# Patient Record
Sex: Male | Born: 1985 | Race: Black or African American | Hispanic: No | Marital: Single | Smoking: Current every day smoker
Health system: Southern US, Community
[De-identification: ages and names within clinical notes are randomized; demographics above are authoritative.]

## PROBLEM LIST (undated history)

## (undated) DIAGNOSIS — Z789 Other specified health status: Secondary | ICD-10-CM

## (undated) DIAGNOSIS — C8590 Non-Hodgkin lymphoma, unspecified, unspecified site: Secondary | ICD-10-CM

## (undated) HISTORY — PX: OTHER SURGICAL HISTORY: SHX169

---
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
## 2010-05-16 ENCOUNTER — Emergency Department (HOSPITAL_BASED_OUTPATIENT_CLINIC_OR_DEPARTMENT_OTHER)
Admission: EM | Admit: 2010-05-16 | Discharge: 2010-05-16 | Disposition: A | Discharge status: Home / Self Care | Payer: Self-pay | Attending: Emergency Medicine | Admitting: Emergency Medicine

## 2010-05-16 DIAGNOSIS — B86 Scabies: Secondary | ICD-10-CM | POA: Insufficient documentation

## 2010-05-19 LAB — GC/CHLAMYDIA PROBE AMP, GENITAL
Chlamydia, DNA Probe: NEGATIVE
GC Probe Amp, Genital: NEGATIVE

## 2011-12-08 ENCOUNTER — Encounter (HOSPITAL_COMMUNITY): Payer: Self-pay | Admitting: *Deleted

## 2011-12-08 ENCOUNTER — Emergency Department (HOSPITAL_COMMUNITY)
Admission: EM | Admit: 2011-12-08 | Discharge: 2011-12-08 | Disposition: A | Discharge status: Home / Self Care | Payer: Self-pay | Attending: Emergency Medicine | Admitting: Emergency Medicine

## 2011-12-08 DIAGNOSIS — X12XXXA Contact with other hot fluids, initial encounter: Secondary | ICD-10-CM | POA: Insufficient documentation

## 2011-12-08 DIAGNOSIS — IMO0002 Reserved for concepts with insufficient information to code with codable children: Secondary | ICD-10-CM

## 2011-12-08 DIAGNOSIS — Y93G9 Activity, other involving cooking and grilling: Secondary | ICD-10-CM | POA: Insufficient documentation

## 2011-12-08 DIAGNOSIS — T23239A Burn of second degree of unspecified multiple fingers (nail), not including thumb, initial encounter: Secondary | ICD-10-CM | POA: Insufficient documentation

## 2011-12-08 DIAGNOSIS — F17200 Nicotine dependence, unspecified, uncomplicated: Secondary | ICD-10-CM | POA: Insufficient documentation

## 2011-12-08 DIAGNOSIS — X131XXA Other contact with steam and other hot vapors, initial encounter: Secondary | ICD-10-CM | POA: Insufficient documentation

## 2011-12-08 DIAGNOSIS — Y929 Unspecified place or not applicable: Secondary | ICD-10-CM | POA: Insufficient documentation

## 2011-12-08 DIAGNOSIS — Z8589 Personal history of malignant neoplasm of other organs and systems: Secondary | ICD-10-CM | POA: Insufficient documentation

## 2011-12-08 HISTORY — DX: Non-Hodgkin lymphoma, unspecified, unspecified site: C85.90

## 2011-12-08 MED ORDER — SILVER SULFADIAZINE 1 % EX CREA: TOPICAL_CREAM | Freq: Every day | CUTANEOUS | Status: AC
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
## 2011-12-09 NOTE — ED Provider Notes (Signed)
Medical screening examination/treatment/procedure(s) were performed by non-physician practitioner and as supervising physician I was immediately available for consultation/collaboration.   Lyanne Co, MD 12/09/11 [phone]

## 2017-02-16 ENCOUNTER — Other Ambulatory Visit: Payer: Self-pay

## 2017-02-16 ENCOUNTER — Emergency Department (HOSPITAL_COMMUNITY)
Admission: EM | Admit: 2017-02-16 | Discharge: 2017-02-16 | Disposition: A | Discharge status: Home / Self Care | Payer: Self-pay | Attending: Emergency Medicine | Admitting: Emergency Medicine

## 2017-02-16 ENCOUNTER — Encounter (HOSPITAL_COMMUNITY): Payer: Self-pay | Admitting: Emergency Medicine

## 2017-02-16 DIAGNOSIS — Z5321 Procedure and treatment not carried out due to patient leaving prior to being seen by health care provider: Secondary | ICD-10-CM | POA: Insufficient documentation

## 2017-02-16 DIAGNOSIS — R109 Unspecified abdominal pain: Secondary | ICD-10-CM | POA: Insufficient documentation

## 2017-02-16 LAB — BASIC METABOLIC PANEL
Anion gap: 9 (ref 5–15)
BUN: 18 mg/dL (ref 6–20)
CALCIUM: 9.5 mg/dL (ref 8.9–10.3)
CHLORIDE: 103 mmol/L (ref 101–111)
CO2: 27 mmol/L (ref 22–32)
CREATININE: 1.12 mg/dL (ref 0.61–1.24)
GFR calc non Af Amer: >60 mL/min (ref >60)
Glucose, Bld: 109 mg/dL — ABNORMAL HIGH (ref 65–99)
Potassium: 3.4 mmol/L — ABNORMAL LOW (ref 3.5–5.1)
SODIUM: 139 mmol/L (ref 135–145)

## 2017-02-16 LAB — CBC
HCT: 42.3 % (ref 39.0–52.0)
Hemoglobin: 14.2 g/dL (ref 13.0–17.0)
MCH: 30.2 pg (ref 26.0–34.0)
MCHC: 33.6 g/dL (ref 30.0–36.0)
MCV: 90 fL (ref 78.0–100.0)
PLATELETS: 159 10*3/uL (ref 150–400)
RBC: 4.7 MIL/uL (ref 4.22–5.81)
RDW: 12.3 % (ref 11.5–15.5)
WBC: 9.7 10*3/uL (ref 4.0–10.5)

## 2017-02-16 LAB — URINALYSIS, ROUTINE W REFLEX MICROSCOPIC
BACTERIA UA: NONE SEEN
BILIRUBIN URINE: NEGATIVE
Glucose, UA: NEGATIVE mg/dL
HGB URINE DIPSTICK: NEGATIVE
KETONES UR: 5 mg/dL — AB
LEUKOCYTES UA: NEGATIVE
NITRITE: NEGATIVE
PH: 5 (ref 5.0–8.0)
Protein, ur: 100 mg/dL — AB
SPECIFIC GRAVITY, URINE: 1.027 (ref 1.005–1.030)

## 2017-02-16 NOTE — ED Triage Notes (Signed)
Pt c/o intermittent left sided flank pain x "a few days". Denies urinary symptoms.

## 2017-03-03 ENCOUNTER — Encounter (HOSPITAL_COMMUNITY): Payer: Self-pay | Admitting: Emergency Medicine

## 2017-03-03 ENCOUNTER — Emergency Department (HOSPITAL_COMMUNITY)
Admission: EM | Admit: 2017-03-03 | Discharge: 2017-03-03 | Disposition: A | Discharge status: Home / Self Care | Payer: Self-pay | Attending: Emergency Medicine | Admitting: Emergency Medicine

## 2017-03-03 ENCOUNTER — Other Ambulatory Visit: Payer: Self-pay

## 2017-03-03 DIAGNOSIS — F17210 Nicotine dependence, cigarettes, uncomplicated: Secondary | ICD-10-CM | POA: Insufficient documentation

## 2017-03-03 DIAGNOSIS — Y999 Unspecified external cause status: Secondary | ICD-10-CM | POA: Insufficient documentation

## 2017-03-03 DIAGNOSIS — X58XXXA Exposure to other specified factors, initial encounter: Secondary | ICD-10-CM | POA: Insufficient documentation

## 2017-03-03 DIAGNOSIS — Y929 Unspecified place or not applicable: Secondary | ICD-10-CM | POA: Insufficient documentation

## 2017-03-03 DIAGNOSIS — Y939 Activity, unspecified: Secondary | ICD-10-CM | POA: Insufficient documentation

## 2017-03-03 DIAGNOSIS — S39012A Strain of muscle, fascia and tendon of lower back, initial encounter: Secondary | ICD-10-CM | POA: Insufficient documentation

## 2017-03-03 MED ORDER — NAPROXEN 250 MG PO TABS
500.0000 mg | ORAL_TABLET | Freq: Once | ORAL | Status: AC
  Administered 2017-03-03: 500 mg via ORAL
  Filled 2017-03-03: qty 2

## 2017-03-03 MED ORDER — CYCLOBENZAPRINE HCL 5 MG PO TABS: 5.0000 mg | ORAL_TABLET | Freq: Two times a day (BID) | ORAL | 0 refills | Status: AC | PRN

## 2017-03-03 MED ORDER — NAPROXEN 500 MG PO TABS: 500.0000 mg | ORAL_TABLET | Freq: Two times a day (BID) | ORAL | 0 refills | Status: AC

## 2017-03-03 NOTE — ED Provider Notes (Signed)
[facility] EMERGENCY DEPARTMENT Provider Note   CSN: [id number] Arrival date & time: 03/03/17  0059     History   Chief Complaint Chief Complaint  Patient presents with  . Back Pain    HPI Frank Barber is a 32 y.o. male.  HPI  This is a 32 year old male who presents with back pain.  Patient reports 2-week history of bilateral back pain.  Worse with lifting.  Patient states that he lifts at work.  He has not taken anything for this pain.  He rates his pain at 6 out of 10.  It keeps him up at night.  He denies any urinary symptoms.  He denies any weakness, numbness, tingling of the lower extremities, bowel or bladder difficulty.  Past Medical History:  Diagnosis Date  . Lymphoma ([place])     There are no active problems to display for this patient.   Past Surgical History:  Procedure Laterality Date  . laporatomy         Home Medications    Prior to Admission medications   Medication Sig Start Date End Date Taking? Authorizing Provider  cyclobenzaprine (FLEXERIL) 5 MG tablet Take 1 tablet (5 mg total) by mouth 2 (two) times daily as needed for muscle spasms. 03/03/17   Demarea Lorey, Barbette Hair, MD  naproxen (NAPROSYN) 500 MG tablet Take 1 tablet (500 mg total) by mouth 2 (two) times daily. 03/03/17   Shiryl Ruddy, Barbette Hair, MD  silver sulfADIAZINE (SILVADENE) 1 % cream Apply topically daily. 12/08/11   Hess, Hessie Diener, PA-C    Family History History reviewed. No pertinent family history.  Social History Social History   Tobacco Use  . Smoking status: Current Every Day Smoker    Types: Cigarettes  . Smokeless tobacco: Never Used  Substance Use Topics  . Alcohol use: No  . Drug use: Yes    Types: Marijuana     Allergies   Patient has no known allergies.   Review of Systems Review of Systems  Constitutional: Negative for fever.  Genitourinary: Negative for difficulty urinating.  Musculoskeletal: Positive for back pain.  Neurological:  Negative for weakness and numbness.  All other systems reviewed and are negative.    Physical Exam Updated Vital Signs BP 126/74 (BP Location: Right Arm)   Pulse 75   Temp 97.9 F (36.6 C) (Oral)   Resp 20   Ht 6\' 1"  (1.854 m)   Wt 63.5 kg (140 lb)   SpO2 99%   BMI 18.47 kg/m   Physical Exam  Constitutional: He is oriented to person, place, and time. He appears well-developed and well-nourished.  HENT:  Head: Normocephalic and atraumatic.  Cardiovascular: Normal rate, regular rhythm and normal heart sounds.  No murmur heard. Pulmonary/Chest: Effort normal and breath sounds normal. No respiratory distress. He has no wheezes.  Musculoskeletal: He exhibits no edema.  Tenderness to palpation bilateral paraspinous muscle region of the upper lumbar spine, no midline tenderness, step-off, or deformity  Neurological: He is alert and oriented to person, place, and time.  5 out of 5 strength bilateral lower extremities, symmetric patellar reflexes bilaterally, no clonus  Skin: Skin is warm and dry.  Psychiatric: He has a normal mood and affect.  Nursing note and vitals reviewed.    ED Treatments / Results  Labs (all labs ordered are listed, but only abnormal results are displayed) Labs Reviewed - No data to display  EKG  EKG Interpretation None       Radiology  No results found.  Procedures Procedures (including critical care time)  Medications Ordered in ED Medications  naproxen (NAPROSYN) tablet 500 mg (not administered)     Initial Impression / Assessment and Plan / ED Course  I have reviewed the triage vital signs and the nursing notes.  Pertinent labs & imaging results that were available during my care of the patient were reviewed by me and considered in my medical decision making (see chart for details).     Patient presents with back pain.  Musculoskeletal in nature.  No red flags.  No signs or symptoms of cauda equina.  Recommend anti-inflammatory and  muscle relaxant.  Patient instructed not to drive or operate heavy machinery using muscle relaxers.  After history, exam, and medical workup I feel the patient has been appropriately medically screened and is safe for discharge home. Pertinent diagnoses were discussed with the patient. Patient was given return precautions.  Final Clinical Impressions(s) / ED Diagnoses   Final diagnoses:  Strain of lumbar region, initial encounter    ED Discharge Orders        Ordered    naproxen (NAPROSYN) 500 MG tablet  2 times daily     03/03/17 0219    cyclobenzaprine (FLEXERIL) 5 MG tablet  2 times daily PRN     03/03/17 0219       Merryl Hacker, MD 03/03/17 0221

## 2017-03-03 NOTE — ED Triage Notes (Signed)
Pt c/o 7/10 lower back pain for the past 2 weeks getting worse pt denies any injury no urinary symptoms

## 2017-03-03 NOTE — Discharge Instructions (Signed)
Seen today for back pain.  This is likely musculoskeletal in nature.  Take naproxen.  Flexeril as needed.  Do not drive or operate heavy machinery after taking Flexeril.  Avoid heavy lifting until symptoms improve.

## 2017-03-03 NOTE — ED Notes (Signed)
Pt reports heavy lifting while working. Pt complains of pain to lower back on sides not spine

## 2018-07-12 ENCOUNTER — Inpatient Hospital Stay (HOSPITAL_COMMUNITY)
Admission: EM | Admit: 2018-07-12 | Discharge: 2018-07-15 | DRG: 964 | Disposition: A | Discharge status: Home / Self Care | Payer: Self-pay | Attending: General Surgery | Admitting: General Surgery

## 2018-07-12 ENCOUNTER — Encounter (HOSPITAL_COMMUNITY): Payer: Self-pay

## 2018-07-12 ENCOUNTER — Emergency Department (HOSPITAL_COMMUNITY): Payer: Self-pay

## 2018-07-12 DIAGNOSIS — Y92410 Unspecified street and highway as the place of occurrence of the external cause: Secondary | ICD-10-CM

## 2018-07-12 DIAGNOSIS — Z9689 Presence of other specified functional implants: Secondary | ICD-10-CM

## 2018-07-12 DIAGNOSIS — S270XXA Traumatic pneumothorax, initial encounter: Principal | ICD-10-CM | POA: Diagnosis present

## 2018-07-12 DIAGNOSIS — S27322A Contusion of lung, bilateral, initial encounter: Secondary | ICD-10-CM | POA: Diagnosis present

## 2018-07-12 DIAGNOSIS — Z1159 Encounter for screening for other viral diseases: Secondary | ICD-10-CM

## 2018-07-12 DIAGNOSIS — S27331A Laceration of lung, unilateral, initial encounter: Secondary | ICD-10-CM | POA: Diagnosis present

## 2018-07-12 DIAGNOSIS — S41112A Laceration without foreign body of left upper arm, initial encounter: Secondary | ICD-10-CM | POA: Diagnosis present

## 2018-07-12 DIAGNOSIS — J939 Pneumothorax, unspecified: Secondary | ICD-10-CM | POA: Diagnosis present

## 2018-07-12 DIAGNOSIS — F1010 Alcohol abuse, uncomplicated: Secondary | ICD-10-CM | POA: Diagnosis present

## 2018-07-12 DIAGNOSIS — S27329A Contusion of lung, unspecified, initial encounter: Secondary | ICD-10-CM

## 2018-07-12 DIAGNOSIS — S36115A Moderate laceration of liver, initial encounter: Secondary | ICD-10-CM | POA: Diagnosis present

## 2018-07-12 DIAGNOSIS — S27339A Laceration of lung, unspecified, initial encounter: Secondary | ICD-10-CM

## 2018-07-12 DIAGNOSIS — E876 Hypokalemia: Secondary | ICD-10-CM | POA: Diagnosis present

## 2018-07-12 DIAGNOSIS — S32009A Unspecified fracture of unspecified lumbar vertebra, initial encounter for closed fracture: Secondary | ICD-10-CM

## 2018-07-12 DIAGNOSIS — S36113A Laceration of liver, unspecified degree, initial encounter: Secondary | ICD-10-CM

## 2018-07-12 DIAGNOSIS — Z4682 Encounter for fitting and adjustment of non-vascular catheter: Secondary | ICD-10-CM

## 2018-07-12 DIAGNOSIS — Y907 Blood alcohol level of 200-239 mg/100 ml: Secondary | ICD-10-CM | POA: Diagnosis present

## 2018-07-12 HISTORY — DX: Other specified health status: Z78.9

## 2018-07-12 LAB — COMPREHENSIVE METABOLIC PANEL
ALT: 181 U/L — ABNORMAL HIGH (ref 0–44)
AST: 311 U/L — ABNORMAL HIGH (ref 15–41)
Albumin: 4 g/dL (ref 3.5–5.0)
Alkaline Phosphatase: 40 U/L (ref 38–126)
Anion gap: 12 (ref 5–15)
BUN: 18 mg/dL (ref 6–20)
CO2: 22 mmol/L (ref 22–32)
Calcium: 8.5 mg/dL — ABNORMAL LOW (ref 8.9–10.3)
Chloride: 106 mmol/L (ref 98–111)
Creatinine, Ser: 1.31 mg/dL — ABNORMAL HIGH (ref 0.61–1.24)
GFR calc Af Amer: >60 mL/min (ref >60)
GFR calc non Af Amer: >60 mL/min (ref >60)
Glucose, Bld: 191 mg/dL — ABNORMAL HIGH (ref 70–99)
Potassium: 3.3 mmol/L — ABNORMAL LOW (ref 3.5–5.1)
Sodium: 140 mmol/L (ref 135–145)
Total Bilirubin: 1.2 mg/dL (ref 0.3–1.2)
Total Protein: 6.2 g/dL — ABNORMAL LOW (ref 6.5–8.1)

## 2018-07-12 LAB — POCT I-STAT 7, (LYTES, BLD GAS, ICA,H+H)
Acid-base deficit: 3 mmol/L — ABNORMAL HIGH (ref 0.0–2.0)
Bicarbonate: 22.4 mmol/L (ref 20.0–28.0)
Calcium, Ion: 1.11 mmol/L — ABNORMAL LOW (ref 1.15–1.40)
HCT: 39 % (ref 39.0–52.0)
Hemoglobin: 13.3 g/dL (ref 13.0–17.0)
O2 Saturation: 100 %
Patient temperature: 98.6
Potassium: 2.7 mmol/L — CL (ref 3.5–5.1)
Sodium: 139 mmol/L (ref 135–145)
TCO2: 24 mmol/L (ref 22–32)
pCO2 arterial: 38.7 mmHg (ref 32.0–48.0)
pH, Arterial: 7.371 (ref 7.350–7.450)
pO2, Arterial: 428 mmHg — ABNORMAL HIGH (ref 83.0–108.0)

## 2018-07-12 LAB — URINALYSIS, ROUTINE W REFLEX MICROSCOPIC
Bacteria, UA: NONE SEEN
Bilirubin Urine: NEGATIVE
Glucose, UA: NEGATIVE mg/dL
Ketones, ur: NEGATIVE mg/dL
Leukocytes,Ua: NEGATIVE
Nitrite: NEGATIVE
Protein, ur: NEGATIVE mg/dL
RBC / HPF: >50 RBC/hpf — ABNORMAL HIGH (ref 0–5)
Specific Gravity, Urine: 1.024 (ref 1.005–1.030)
WBC, UA: >50 WBC/hpf — ABNORMAL HIGH (ref 0–5)
pH: 7 (ref 5.0–8.0)

## 2018-07-12 LAB — I-STAT CHEM 8, ED
BUN: 23 mg/dL — ABNORMAL HIGH (ref 6–20)
Calcium, Ion: 1.05 mmol/L — ABNORMAL LOW (ref 1.15–1.40)
Chloride: 105 mmol/L (ref 98–111)
Creatinine, Ser: 1.7 mg/dL — ABNORMAL HIGH (ref 0.61–1.24)
Glucose, Bld: 185 mg/dL — ABNORMAL HIGH (ref 70–99)
HCT: 41 % (ref 39.0–52.0)
Hemoglobin: 13.9 g/dL (ref 13.0–17.0)
Potassium: 3.2 mmol/L — ABNORMAL LOW (ref 3.5–5.1)
Sodium: 140 mmol/L (ref 135–145)
TCO2: 24 mmol/L (ref 22–32)

## 2018-07-12 LAB — RAPID URINE DRUG SCREEN, HOSP PERFORMED
Amphetamines: NOT DETECTED
Barbiturates: NOT DETECTED
Benzodiazepines: NOT DETECTED
Cocaine: NOT DETECTED
Opiates: NOT DETECTED
Tetrahydrocannabinol: POSITIVE — AB

## 2018-07-12 LAB — CBC
HCT: 37.8 % — ABNORMAL LOW (ref 39.0–52.0)
Hemoglobin: 12.5 g/dL — ABNORMAL LOW (ref 13.0–17.0)
MCH: 31.7 pg (ref 26.0–34.0)
MCHC: 33.1 g/dL (ref 30.0–36.0)
MCV: 95.9 fL (ref 80.0–100.0)
Platelets: 175 10*3/uL (ref 150–400)
RBC: 3.94 MIL/uL — ABNORMAL LOW (ref 4.22–5.81)
RDW: 13.2 % (ref 11.5–15.5)
WBC: 15.4 10*3/uL — ABNORMAL HIGH (ref 4.0–10.5)
nRBC: 0.1 % (ref 0.0–0.2)

## 2018-07-12 LAB — CDS SEROLOGY

## 2018-07-12 LAB — ETHANOL: Alcohol, Ethyl (B): 213 mg/dL — ABNORMAL HIGH (ref <10)

## 2018-07-12 LAB — PROTIME-INR
INR: 1.2 (ref 0.8–1.2)
Prothrombin Time: 15.3 seconds — ABNORMAL HIGH (ref 11.4–15.2)

## 2018-07-12 LAB — SARS CORONAVIRUS 2 BY RT PCR (HOSPITAL ORDER, PERFORMED IN ~~LOC~~ HOSPITAL LAB): SARS Coronavirus 2: NEGATIVE

## 2018-07-12 MED ORDER — VECURONIUM BROMIDE 10 MG IV SOLR
INTRAVENOUS | Status: AC | PRN
  Administered 2018-07-12: 10 mg via INTRAVENOUS

## 2018-07-12 MED ORDER — VECURONIUM BROMIDE 10 MG IV SOLR
INTRAVENOUS | Status: AC
  Filled 2018-07-12: qty 10

## 2018-07-12 MED ORDER — ETOMIDATE 2 MG/ML IV SOLN
INTRAVENOUS | Status: AC | PRN
  Administered 2018-07-12: 20 mg via INTRAVENOUS

## 2018-07-12 MED ORDER — PROPOFOL 1000 MG/100ML IV EMUL
INTRAVENOUS | Status: AC
  Administered 2018-07-12: 5 ug/kg/min
  Filled 2018-07-12: qty 100

## 2018-07-12 MED ORDER — IOHEXOL 300 MG/ML  SOLN
100.0000 mL | Freq: Once | INTRAMUSCULAR | Status: AC | PRN
  Administered 2018-07-12: 100 mL via INTRAVENOUS

## 2018-07-12 MED ORDER — LORAZEPAM 2 MG/ML IJ SOLN
INTRAMUSCULAR | Status: AC
  Administered 2018-07-12: 22:00:00
  Filled 2018-07-12: qty 1

## 2018-07-12 MED ORDER — SUCCINYLCHOLINE CHLORIDE 20 MG/ML IJ SOLN
INTRAMUSCULAR | Status: AC | PRN
  Administered 2018-07-12: 100 mg via INTRAVENOUS
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
## 2018-07-12 NOTE — ED Provider Notes (Signed)
Procedure Name: Intubation Date/Time: 07/12/2018 11:59 PM Performed by: Valarie Merino, MD Pre-anesthesia Checklist: Patient identified, Emergency Drugs available, Suction available and Patient being monitored Oxygen Delivery Method: Non-rebreather mask Preoxygenation: Pre-oxygenation with 100% oxygen Induction Type: IV induction, Rapid sequence and Cricoid Pressure applied Ventilation: Mask ventilation without difficulty Laryngoscope Size: Mac and 4 Grade View: Grade I Tube size: 7.5 mm Number of attempts: 1 Airway Equipment and Method: Stylet Placement Confirmation: ETT inserted through vocal cords under direct vision,  Breath sounds checked- equal and bilateral,  Positive ETCO2 and CO2 detector Secured at: 23 cm Tube secured with: Tape Dental Injury: Teeth and Oropharynx as per pre-operative assessment          Valarie Merino, MD 07/13/18 0000
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
## 2018-07-13 ENCOUNTER — Inpatient Hospital Stay (HOSPITAL_COMMUNITY): Payer: Self-pay

## 2018-07-13 DIAGNOSIS — J939 Pneumothorax, unspecified: Secondary | ICD-10-CM | POA: Diagnosis present

## 2018-07-13 LAB — PREPARE FRESH FROZEN PLASMA
Unit division: 0
Unit division: 0

## 2018-07-13 LAB — BLOOD PRODUCT ORDER (VERBAL) VERIFICATION

## 2018-07-13 LAB — BPAM RBC
Blood Product Expiration Date: 202006242359
Blood Product Expiration Date: 202006242359
ISSUE DATE / TIME: 202006022129
ISSUE DATE / TIME: 202006022129
Unit Type and Rh: 5100
Unit Type and Rh: 5100

## 2018-07-13 LAB — BASIC METABOLIC PANEL
Anion gap: 16 — ABNORMAL HIGH (ref 5–15)
BUN: 17 mg/dL (ref 6–20)
CO2: 20 mmol/L — ABNORMAL LOW (ref 22–32)
Calcium: 8.2 mg/dL — ABNORMAL LOW (ref 8.9–10.3)
Chloride: 108 mmol/L (ref 98–111)
Creatinine, Ser: 1.09 mg/dL (ref 0.61–1.24)
GFR calc Af Amer: >60 mL/min (ref >60)
GFR calc non Af Amer: >60 mL/min (ref >60)
Glucose, Bld: 86 mg/dL (ref 70–99)
Potassium: 3.4 mmol/L — ABNORMAL LOW (ref 3.5–5.1)
Sodium: 144 mmol/L (ref 135–145)

## 2018-07-13 LAB — BPAM FFP
Blood Product Expiration Date: 202006182359
Blood Product Expiration Date: 202006202359
ISSUE DATE / TIME: 202006022129
ISSUE DATE / TIME: 202006022129
Unit Type and Rh: 6200
Unit Type and Rh: 6200

## 2018-07-13 LAB — TYPE AND SCREEN
ABO/RH(D): O POS
Antibody Screen: NEGATIVE
Unit division: 0
Unit division: 0

## 2018-07-13 LAB — CBC
HCT: 36.2 % — ABNORMAL LOW (ref 39.0–52.0)
Hemoglobin: 12 g/dL — ABNORMAL LOW (ref 13.0–17.0)
MCH: 31.4 pg (ref 26.0–34.0)
MCHC: 33.1 g/dL (ref 30.0–36.0)
MCV: 94.8 fL (ref 80.0–100.0)
Platelets: 144 10*3/uL — ABNORMAL LOW (ref 150–400)
RBC: 3.82 MIL/uL — ABNORMAL LOW (ref 4.22–5.81)
RDW: 13.3 % (ref 11.5–15.5)
WBC: 15.1 10*3/uL — ABNORMAL HIGH (ref 4.0–10.5)
nRBC: 0 % (ref 0.0–0.2)

## 2018-07-13 LAB — ABO/RH: ABO/RH(D): O POS

## 2018-07-13 LAB — TRIGLYCERIDES: Triglycerides: 304 mg/dL — ABNORMAL HIGH (ref <150)

## 2018-07-13 LAB — SURGICAL PCR SCREEN
MRSA, PCR: NEGATIVE
Staphylococcus aureus: NEGATIVE

## 2018-07-13 LAB — HIV ANTIBODY (ROUTINE TESTING W REFLEX): HIV Screen 4th Generation wRfx: NONREACTIVE

## 2018-07-13 MED ORDER — PANTOPRAZOLE SODIUM 40 MG PO TBEC
40.0000 mg | DELAYED_RELEASE_TABLET | Freq: Every day | ORAL | Status: DC
  Administered 2018-07-14 – 2018-07-15 (×2): 40 mg via ORAL
  Filled 2018-07-13 (×2): qty 1

## 2018-07-13 MED ORDER — ORAL CARE MOUTH RINSE
15.0000 mL | Freq: Two times a day (BID) | OROMUCOSAL | Status: DC
  Administered 2018-07-13: 15 mL via OROMUCOSAL

## 2018-07-13 MED ORDER — FENTANYL CITRATE (PF) 100 MCG/2ML IJ SOLN: 50.0000 ug | Freq: Once | INTRAMUSCULAR | Status: DC

## 2018-07-13 MED ORDER — PANTOPRAZOLE SODIUM 40 MG IV SOLR
40.0000 mg | Freq: Every day | INTRAVENOUS | Status: DC
  Administered 2018-07-13: 40 mg via INTRAVENOUS
  Filled 2018-07-13 (×2): qty 40

## 2018-07-13 MED ORDER — CHLORHEXIDINE GLUCONATE CLOTH 2 % EX PADS: 6.0000 | MEDICATED_PAD | Freq: Every day | CUTANEOUS | Status: DC

## 2018-07-13 MED ORDER — ORAL CARE MOUTH RINSE
15.0000 mL | OROMUCOSAL | Status: DC
  Administered 2018-07-13 (×6): 15 mL via OROMUCOSAL

## 2018-07-13 MED ORDER — FENTANYL 2500MCG IN NS 250ML (10MCG/ML) PREMIX INFUSION
50.0000 ug/h | INTRAVENOUS | Status: DC
  Administered 2018-07-13: 50 ug/h via INTRAVENOUS
  Filled 2018-07-13: qty 250

## 2018-07-13 MED ORDER — POTASSIUM CHLORIDE 10 MEQ/100ML IV SOLN
10.0000 meq | INTRAVENOUS | Status: AC
  Administered 2018-07-13 (×4): 10 meq via INTRAVENOUS
  Filled 2018-07-13 (×4): qty 100

## 2018-07-13 MED ORDER — HYDROMORPHONE HCL 1 MG/ML IJ SOLN
0.5000 mg | INTRAMUSCULAR | Status: DC | PRN
  Administered 2018-07-13 – 2018-07-14 (×3): 0.5 mg via INTRAVENOUS
  Filled 2018-07-13 (×3): qty 1

## 2018-07-13 MED ORDER — OXYCODONE HCL 5 MG PO TABS
5.0000 mg | ORAL_TABLET | ORAL | Status: DC | PRN
  Administered 2018-07-13 – 2018-07-14 (×2): 5 mg via ORAL
  Filled 2018-07-13 (×3): qty 1

## 2018-07-13 MED ORDER — FENTANYL BOLUS VIA INFUSION
50.0000 ug | INTRAVENOUS | Status: DC | PRN
  Filled 2018-07-13: qty 50

## 2018-07-13 MED ORDER — PROPOFOL 1000 MG/100ML IV EMUL
0.0000 ug/kg/min | INTRAVENOUS | Status: DC
  Administered 2018-07-13: 35 ug/kg/min via INTRAVENOUS
  Filled 2018-07-13: qty 100

## 2018-07-13 MED ORDER — CHLORHEXIDINE GLUCONATE 0.12% ORAL RINSE (MEDLINE KIT)
15.0000 mL | Freq: Two times a day (BID) | OROMUCOSAL | Status: DC
  Administered 2018-07-13 (×2): 15 mL via OROMUCOSAL

## 2018-07-13 MED ORDER — ACETAMINOPHEN 325 MG PO TABS
650.0000 mg | ORAL_TABLET | Freq: Four times a day (QID) | ORAL | Status: DC
  Administered 2018-07-13 – 2018-07-15 (×7): 650 mg via ORAL
  Filled 2018-07-13 (×8): qty 2

## 2018-07-13 MED ORDER — ONDANSETRON 4 MG PO TBDP
4.0000 mg | ORAL_TABLET | Freq: Four times a day (QID) | ORAL | Status: DC | PRN
  Filled 2018-07-13: qty 1

## 2018-07-13 MED ORDER — ONDANSETRON HCL 4 MG/2ML IJ SOLN: 4.0000 mg | Freq: Four times a day (QID) | INTRAMUSCULAR | Status: DC | PRN

## 2018-07-13 NOTE — ED Notes (Signed)
Gaylene Brooks Bettis [phone] [phone] significant other wanting update

## 2018-07-13 NOTE — Procedures (Signed)
Chest Tube Insertion Procedure Note  Indications:  Clinically significant R pneumothorax  Pre-operative Diagnosis: R pneumothorax  Post-operative Diagnosis: R pneumothorax  Procedure Details  Emergency consent  After sterile skin prep, using standard technique, a 14 French tube was placed in the right anterior axillary line, above the nipple level using Seldinger technique. Tube hooked up to pleurevac. Sterile dressing applied. Findings: air  Estimated Blood Loss:  min         Specimens:  None              Complications:  None; patient tolerated the procedure well.         Disposition: ICU - intubated and critically ill.         Condition: stable  Georganna Skeans, MD, MPH, FACS Trauma & General Surgery: [phone]

## 2018-07-13 NOTE — Progress Notes (Signed)
173mL of IV Fentanyl wasted with Alyce Pagan. Huntleigh Doolen, Rande Brunt, RN

## 2018-07-13 NOTE — Procedures (Signed)
Extubation Procedure Note  Patient Details:   Name: Frank Barber DOB: 09-Jan-1986 MRN: [id number]   Airway Documentation:    Vent end date: 07/13/18 Vent end time: 1134   Evaluation  O2 sats: stable throughout Complications: No apparent complications Patient did tolerate procedure well. Bilateral Breath Sounds: Diminished   Yes   Pt extubated to 4L N/C.  No stridor noted.  RN @ bedside.  Donnetta Hail 07/13/2018, 11:35 AM
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
## 2018-07-14 ENCOUNTER — Encounter (HOSPITAL_COMMUNITY): Payer: Self-pay | Admitting: General Practice

## 2018-07-14 ENCOUNTER — Inpatient Hospital Stay (HOSPITAL_COMMUNITY): Payer: Self-pay

## 2018-07-14 LAB — CBC
HCT: 31.3 % — ABNORMAL LOW (ref 39.0–52.0)
Hemoglobin: 10.5 g/dL — ABNORMAL LOW (ref 13.0–17.0)
MCH: 31.3 pg (ref 26.0–34.0)
MCHC: 33.5 g/dL (ref 30.0–36.0)
MCV: 93.2 fL (ref 80.0–100.0)
Platelets: 117 10*3/uL — ABNORMAL LOW (ref 150–400)
RBC: 3.36 MIL/uL — ABNORMAL LOW (ref 4.22–5.81)
RDW: 13.2 % (ref 11.5–15.5)
WBC: 10.3 10*3/uL (ref 4.0–10.5)
nRBC: 0 % (ref 0.0–0.2)

## 2018-07-14 LAB — BASIC METABOLIC PANEL
Anion gap: 9 (ref 5–15)
BUN: 11 mg/dL (ref 6–20)
CO2: 24 mmol/L (ref 22–32)
Calcium: 8.5 mg/dL — ABNORMAL LOW (ref 8.9–10.3)
Chloride: 104 mmol/L (ref 98–111)
Creatinine, Ser: 0.8 mg/dL (ref 0.61–1.24)
GFR calc Af Amer: >60 mL/min (ref >60)
GFR calc non Af Amer: >60 mL/min (ref >60)
Glucose, Bld: 94 mg/dL (ref 70–99)
Potassium: 3.7 mmol/L (ref 3.5–5.1)
Sodium: 137 mmol/L (ref 135–145)

## 2018-07-14 LAB — MAGNESIUM: Magnesium: 2 mg/dL (ref 1.7–2.4)

## 2018-07-14 LAB — TRIGLYCERIDES: Triglycerides: 118 mg/dL (ref <150)

## 2018-07-14 MED ORDER — OXYCODONE HCL 5 MG PO TABS
10.0000 mg | ORAL_TABLET | ORAL | Status: DC | PRN
  Administered 2018-07-14 – 2018-07-15 (×4): 10 mg via ORAL
  Filled 2018-07-14 (×4): qty 2

## 2018-07-14 MED ORDER — SODIUM CHLORIDE 0.9 % IV SOLN: 250.0000 mL | INTRAVENOUS | Status: DC | PRN

## 2018-07-14 MED ORDER — SODIUM CHLORIDE 0.9% FLUSH
3.0000 mL | Freq: Two times a day (BID) | INTRAVENOUS | Status: DC
  Administered 2018-07-14 – 2018-07-15 (×3): 3 mL via INTRAVENOUS

## 2018-07-14 MED ORDER — ORAL CARE MOUTH RINSE
15.0000 mL | Freq: Two times a day (BID) | OROMUCOSAL | Status: DC
  Administered 2018-07-14 (×2): 15 mL via OROMUCOSAL

## 2018-07-14 MED ORDER — SODIUM CHLORIDE 0.9% FLUSH: 3.0000 mL | INTRAVENOUS | Status: DC | PRN

## 2018-07-14 MED ORDER — IBUPROFEN 600 MG PO TABS
600.0000 mg | ORAL_TABLET | Freq: Four times a day (QID) | ORAL | Status: DC | PRN
  Administered 2018-07-14: 600 mg via ORAL
  Filled 2018-07-14: qty 1
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
## 2018-07-14 NOTE — Plan of Care (Signed)

## 2018-07-14 NOTE — Progress Notes (Signed)
Verbal consent given by patient Frank Barber to give health information to sister Tayari Yankee.
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
## 2018-07-14 NOTE — Progress Notes (Signed)
   07/14/18 1758  Vitals  Temp (!) 100.6 F (38.1 C) (RN notified )  Temp Source Oral  BP 127/84  MAP (mmHg) 96  BP Location Right Arm  BP Method Automatic  Patient Position (if appropriate) Lying  Pulse Rate (!) 105 (RN notified)  Pulse Rate Source Monitor  Resp (!) 24 (RN notified)  Oxygen Therapy  SpO2 96 %  O2 Device Room Air  MEWS Score  MEWS RR 1  MEWS Pulse 1  MEWS Systolic 0  MEWS LOC 1  MEWS Temp 1  MEWS Score 4  MEWS Score Color Red   [facility] answering service called for MD on call to notify of vital signs; Wells Guiles of [facility] stated she would send the text page to the on-call physician

## 2018-07-15 ENCOUNTER — Inpatient Hospital Stay (HOSPITAL_COMMUNITY): Payer: Self-pay

## 2018-07-15 ENCOUNTER — Other Ambulatory Visit: Payer: Self-pay

## 2018-07-15 ENCOUNTER — Encounter (HOSPITAL_COMMUNITY): Payer: Self-pay | Admitting: Emergency Medicine

## 2018-07-15 ENCOUNTER — Encounter (HOSPITAL_COMMUNITY): Payer: Self-pay | Admitting: *Deleted

## 2018-07-15 LAB — CBC
HCT: 29 % — ABNORMAL LOW (ref 39.0–52.0)
Hemoglobin: 9.9 g/dL — ABNORMAL LOW (ref 13.0–17.0)
MCH: 31.4 pg (ref 26.0–34.0)
MCHC: 34.1 g/dL (ref 30.0–36.0)
MCV: 92.1 fL (ref 80.0–100.0)
Platelets: 110 10*3/uL — ABNORMAL LOW (ref 150–400)
RBC: 3.15 MIL/uL — ABNORMAL LOW (ref 4.22–5.81)
RDW: 12.8 % (ref 11.5–15.5)
WBC: 8.8 10*3/uL (ref 4.0–10.5)
nRBC: 0 % (ref 0.0–0.2)

## 2018-07-15 MED ORDER — OXYCODONE HCL 5 MG PO TABS: 5.0000 mg | ORAL_TABLET | ORAL | 0 refills | Status: AC | PRN

## 2018-07-15 MED ORDER — ACETAMINOPHEN 325 MG PO TABS: 650.0000 mg | ORAL_TABLET | ORAL | Status: AC | PRN

## 2018-07-15 MED ORDER — IBUPROFEN 600 MG PO TABS: 600.0000 mg | ORAL_TABLET | Freq: Three times a day (TID) | ORAL | 0 refills | Status: AC | PRN

## 2018-07-15 NOTE — Discharge Instructions (Signed)
Absolutely no strenuous activity or contact sports for 6 weeks.  Liver Laceration  A liver laceration is a tear or a cut in the liver. The liver is an organ that is involved in many important bodily functions. Sometimes, a liver laceration can be a very serious injury. It can cause a lot of bleeding, and surgery may be needed. Other times, a liver laceration may be minor, and bed rest may be all that is needed. Either way, treatment in a hospital is almost always required. Liver lacerations are categorized in grades from 1 to 5. Low numbers identify lacerations that are less severe than lacerations with high numbers.  Grade 1: This is a tear in the outer lining of the liver. It is less than  inch (1 cm) deep.  Grade 2: This is a tear that is about  inch to 1 inch (1 to 3 cm) deep. It is less than 4 inches (10 cm) long.  Grade 3: This is a tear that is slightly more than 1 inch (3 cm) deep.  Grades 4 and 5: These lacerations are very deep. They affect a large part of the liver. What are the causes? This condition may be caused by:  A forceful hit to the area around the liver (blunt trauma), such as in a car crash. Blunt trauma can tear the liver even though it does not break the skin.  An injury in which an object goes through the skin and into the liver (penetrating injury), such as a stab or gunshot wound. What are the signs or symptoms? Common symptoms of this condition include:  A swollen and firm abdomen.  Pain in the abdomen.  Tenderness when pressing on the right side of the abdomen. Other symptoms include:  Bleeding from a penetrating wound.  Bruises on the abdomen.  A fast heartbeat.  Taking quick breaths.  Feeling weak and dizzy. How is this diagnosed? To diagnose this condition, your health care provider will do a physical exam and ask about any injuries to the right side of your abdomen. You may have various tests, such as:  Blood tests. Your blood may be tested  every few hours. This will show whether you are losing blood.  CT scan. This test is done to check for laceration or bleeding.  Laparoscopy. This involves placing a small camera into the abdomen and looking directly at the surface of the liver. How is this treated? Treatment depends on how deep the laceration is and how much bleeding you have. Treatment options include:  Monitoring and bed rest at the hospital. You will have tests often.  Receiving donated blood through an IV tube (transfusion) to replace blood that you have lost. You may need several transfusions.  Surgery to pack gauze pads or special material around the laceration to help it heal or to repair the laceration. Follow these instructions at home:  Take over-the-counter and prescription medicines only as told by your health care provider. Do not take any other medicines unless you ask your health care provider about them first.  Do not drive or use heavy machinery while taking prescription pain medicines.  Rest and limit your activity as told by your health care provider. It may be several months before you can return to your usual routine. Do not participate in activities that involve physical contact or require extra energy until your health care provider approves.  Keep all follow-up visits as told by your health care provider. This is important. Contact a  health care provider if:  Your abdominal pain does not go away.  You feel more weak and tired than usual. Get help right away if:  Your abdominal pain gets worse.  You have a cut on your skin that: ? Has more redness, swelling, or pain around it. ? Has more fluid or blood coming from it. ? Feels warm to the touch. ? Has pus or a bad smell coming from it.  You feel dizzy or very weak.  You have trouble breathing.  You have a fever. This information is not intended to replace advice given to you by your health care provider. Make sure you discuss any  questions you have with your health care provider. Document Released: 02/28/2010 Document Revised: 09/13/2015 Document Reviewed: 09/13/2015 Elsevier Interactive Patient Education  2019 [employer]  Pneumothorax A pneumothorax is commonly called a collapsed lung. It is a condition in which air leaks from a lung and builds up between the thin layer of tissue that covers the lungs (visceral pleura) and the interior wall of the chest cavity (parietal pleura). The air gets trapped outside the lung, between the lung and the chest wall (pleural space). The air takes up space and prevents the lung from fully expanding. This condition sometimes occurs suddenly with no apparent cause. The buildup of air may be small or large. A small pneumothorax may go away on its own. A large pneumothorax will require treatment and hospitalization. What are the causes? This condition may be caused by:  Trauma and injury to the chest wall.  Surgery and other medical procedures.  A complication of an underlying lung problem, especially chronic obstructive pulmonary disease (COPD) or emphysema. Sometimes the cause of this condition is not known. What increases the risk? You are more likely to develop this condition if:  You have an underlying lung problem.  You smoke.  You are 51-29 years old, male, tall, and underweight.  You have a personal or family history of pneumothorax.  You have an eating disorder (anorexia nervosa). This condition can also happen quickly, even in people with no history of lung problems. What are the signs or symptoms? Sometimes a pneumothorax will have no symptoms. When symptoms are present, they can include:  Chest pain.  Shortness of breath.  Increased rate of breathing.  Bluish color to your lips or skin (cyanosis). How is this diagnosed? This condition may be diagnosed by:  A medical history and physical exam.  A chest X-ray, chest CT scan, or ultrasound. How is this  treated? Treatment depends on how severe your condition is. The goal of treatment is to remove the extra air and allow your lung to expand back to its normal size.  For a small pneumothorax: ? No treatment may be needed. ? Extra oxygen is sometimes used to make it go away more quickly.  For a large pneumothorax or a pneumothorax that is causing symptoms, a procedure is done to drain the air from your lungs. To do this, a health care provider may use: ? A needle with a syringe. This is used to suck air from a pleural space where no additional leakage is taking place. ? A chest tube. This is used to suck air where there is ongoing leakage into the pleural space. The chest tube may need to remain in place for several days until the air leak has healed.  In more severe cases, surgery may be needed to repair the damage that is causing the leak.  If  you have multiple pneumothorax episodes or have an air leak that will not heal, a procedure called a pleurodesis may be done. A medicine is placed in the pleural space to irritate the tissues around the lung so that the lung will stick to the chest wall, seal any leaks, and stop any buildup of air in that space. If you have an underlying lung problem, severe symptoms, or a large pneumothorax you will usually need to stay in the hospital. Follow these instructions at home: Lifestyle  Do not use any products that contain nicotine or tobacco, such as cigarettes and e-cigarettes. These are major risk factors in pneumothorax. If you need help quitting, ask your health care provider.  Do not lift anything that is heavier than 10 lb (4.5 kg), or the limit that your health care provider tells you, until he or she says that it is safe.  Avoid activities that take a lot of effort (strenuous) for as long as told by your health care provider.  Return to your normal activities as told by your health care provider. Ask your health care provider what activities are  safe for you.  Do not fly in an airplane or scuba dive until your health care provider says it is okay. General instructions  Take over-the-counter and prescription medicines only as told by your health care provider.  If a cough or pain makes it difficult for you to sleep at night, try sleeping in a semi-upright position in a recliner or by using 2 or 3 pillows.  If you had a chest tube and it was removed, ask your health care provider when you can remove the bandage (dressing). While the dressing is in place, do not allow it to get wet.  Keep all follow-up visits as told by your health care provider. This is important. Contact a health care provider if:  You cough up thick mucus (sputum) that is yellow or green in color.  You were treated with a chest tube, and you have redness, increasing pain, or discharge at the site where it was placed. Get help right away if:  You have increasing chest pain or shortness of breath.  You have a cough that will not go away.  You begin coughing up blood.  You have pain that is getting worse or is not controlled with medicines.  The site where your chest tube was located opens up.  You feel air coming out of the site where the chest tube was placed.  You have a fever or persistent symptoms for more than 2-3 days.  You have a fever and your symptoms suddenly get worse. These symptoms may represent a serious problem that is an emergency. Do not wait to see if the symptoms will go away. Get medical help right away. Call your local emergency services (911 in the [place] Do not drive yourself to the hospital. Summary  A pneumothorax, commonly called a collapsed lung, is a condition in which air leaks from a lung and gets trapped between the lung and the chest wall (pleural space).  The buildup of air may be small or large. A small pneumothorax may go away on its own. A large pneumothorax will require treatment and hospitalization.  Treatment for  this condition depends on how severe the pneumothorax is. The goal of treatment is to remove the extra air and allow the lung to expand back to its normal size. This information is not intended to replace advice given to you by your  health care provider. Make sure you discuss any questions you have with your health care provider. Document Released: 01/26/2005 Document Revised: 01/04/2017 Document Reviewed: 01/04/2017 Elsevier Interactive Patient Education  2019 Elsevier Inc.   Pulmonary Contusion  A pulmonary contusion is a deep bruise to the lung. The bruise causes the lung tissue to swell and bleed into the surrounding area. The lungs will not work right if this happens. You may find it hard to breathe because you are not getting enough oxygen. Follow these instructions at home:  Do not take aspirin for the first few days. Take over-the-counter and prescription medicines only as told by your doctor.  Do your deep breathing exercises as told.  Return to your normal activities as told by your doctor. Talk with your doctor about: ? What activities are safe for you. ? When you can return to driving, work, school, and sports.  Keep all follow-up visits as told by your doctor. This is important. Contact a doctor if:  You have shaking chills.  You cough up thick spit (mucus). Get help right away if:  You have trouble breathing, and it gets worse.  Your chest pain gets worse.  You cough up blood.  You make high-pitched whistling sounds when you breathe (wheeze).  Your lips or nails look blue.  You feel dizzy or you feel like you may pass out (faint).  You feel confused.  You have a fever. This information is not intended to replace advice given to you by your health care provider. Make sure you discuss any questions you have with your health care provider. Document Released: 01/15/2011 Document Revised: 07/04/2015 Document Reviewed: 03/05/2014 Elsevier Interactive Patient  Education  2019 Elsevier Inc.   Liver Laceration  A liver laceration is a tear or a cut in the liver. The liver is an organ that is involved in many important bodily functions. Sometimes, a liver laceration can be a very serious injury. It can cause a lot of bleeding, and surgery may be needed. Other times, a liver laceration may be minor, and bed rest may be all that is needed. Either way, treatment in a hospital is almost always required. Liver lacerations are categorized in grades from 1 to 5. Low numbers identify lacerations that are less severe than lacerations with high numbers.  Grade 1: This is a tear in the outer lining of the liver. It is less than  inch (1 cm) deep.  Grade 2: This is a tear that is about  inch to 1 inch (1 to 3 cm) deep. It is less than 4 inches (10 cm) long.  Grade 3: This is a tear that is slightly more than 1 inch (3 cm) deep.  Grades 4 and 5: These lacerations are very deep. They affect a large part of the liver. What are the causes? This condition may be caused by:  A forceful hit to the area around the liver (blunt trauma), such as in a car crash. Blunt trauma can tear the liver even though it does not break the skin.  An injury in which an object goes through the skin and into the liver (penetrating injury), such as a stab or gunshot wound. What are the signs or symptoms? Common symptoms of this condition include:  A swollen and firm abdomen.  Pain in the abdomen.  Tenderness when pressing on the right side of the abdomen. Other symptoms include:  Bleeding from a penetrating wound.  Bruises on the abdomen.  A fast heartbeat.  Taking quick breaths.  Feeling weak and dizzy. How is this diagnosed? To diagnose this condition, your health care provider will do a physical exam and ask about any injuries to the right side of your abdomen. You may have various tests, such as:  Blood tests. Your blood may be tested every few hours. This will  show whether you are losing blood.  CT scan. This test is done to check for laceration or bleeding.  Laparoscopy. This involves placing a small camera into the abdomen and looking directly at the surface of the liver. How is this treated? Treatment depends on how deep the laceration is and how much bleeding you have. Treatment options include:  Monitoring and bed rest at the hospital. You will have tests often.  Receiving donated blood through an IV tube (transfusion) to replace blood that you have lost. You may need several transfusions.  Surgery to pack gauze pads or special material around the laceration to help it heal or to repair the laceration. Follow these instructions at home:  Take over-the-counter and prescription medicines only as told by your health care provider. Do not take any other medicines unless you ask your health care provider about them first.  Do not drive or use heavy machinery while taking prescription pain medicines.  Rest and limit your activity as told by your health care provider. It may be several months before you can return to your usual routine. Do not participate in activities that involve physical contact or require extra energy until your health care provider approves.  Keep all follow-up visits as told by your health care provider. This is important. Contact a health care provider if:  Your abdominal pain does not go away.  You feel more weak and tired than usual. Get help right away if:  Your abdominal pain gets worse.  You have a cut on your skin that: ? Has more redness, swelling, or pain around it. ? Has more fluid or blood coming from it. ? Feels warm to the touch. ? Has pus or a bad smell coming from it.  You feel dizzy or very weak.  You have trouble breathing.  You have a fever. This information is not intended to replace advice given to you by your health care provider. Make sure you discuss any questions you have with your  health care provider. Document Released: 02/28/2010 Document Revised: 09/13/2015 Document Reviewed: 09/13/2015 Elsevier Interactive Patient Education  2019 [employer].
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
## 2018-07-15 NOTE — Progress Notes (Signed)
Pt discharged with [employer] in stable condition
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
## 2018-08-11 ENCOUNTER — Ambulatory Visit (HOSPITAL_COMMUNITY)
Admission: EM | Admit: 2018-08-11 | Discharge: 2018-08-11 | Disposition: A | Discharge status: Home / Self Care | Payer: Self-pay | Attending: Family Medicine | Admitting: Family Medicine

## 2018-08-11 ENCOUNTER — Encounter (HOSPITAL_COMMUNITY): Payer: Self-pay | Admitting: Emergency Medicine

## 2018-08-11 ENCOUNTER — Other Ambulatory Visit: Payer: Self-pay

## 2018-08-11 DIAGNOSIS — Z202 Contact with and (suspected) exposure to infections with a predominantly sexual mode of transmission: Secondary | ICD-10-CM | POA: Insufficient documentation

## 2018-08-11 MED ORDER — AZITHROMYCIN 250 MG PO TABS
1000.0000 mg | ORAL_TABLET | Freq: Once | ORAL | Status: AC
  Administered 2018-08-11: 1000 mg via ORAL

## 2018-08-11 MED ORDER — AZITHROMYCIN 250 MG PO TABS
ORAL_TABLET | ORAL | Status: AC
  Filled 2018-08-11: qty 4

## 2018-08-11 NOTE — Discharge Instructions (Signed)
We have treated you today for chlamydia, with azithromycin. Please refrain from sexual activity for 7 days while medicine is clearing infection.  We are testing you for Gonorrhea, Chlamydia and Trichomonas. We will call you if anything is positive and let you know if you require any further treatment. Please inform partner of any positive results.  Please return if symptoms not improving with treatment, development of fever, nausea, vomiting, abdominal pain, scrotal pain.

## 2018-08-11 NOTE — ED Triage Notes (Signed)
Pt reports that a sexual partner from a few months ago said she tested positive for chlamydia.  Pt has not had any symptoms since that time.  He just wants to be tested to be sure.

## 2018-08-12 NOTE — ED Provider Notes (Signed)
[facility]    CSN: [id number] Arrival date & time: 08/11/18  1735      History   Chief Complaint Chief Complaint  Patient presents with  . Exposure to STD    HPI Frank Barber is a 33 y.o. male no significant past medical history presenting today for evaluation of STD exposure.  Patient states that a partner of his tested positive for chlamydia.  He is here today to be checked for STDs given this concern.  States that his exposure was a couple months ago.  He states that he has not been tested or treated since before this encounter.  He denies any symptoms of dysuria or penile discharge.  Denies rashes or lesions.  Denies any testicular pain or scrotal swelling.  Denies abdominal pain.  HPI  Past Medical History:  Diagnosis Date  . Allergy history unknown   . Lymphoma ([place])   . Medical history unknown     Patient Active Problem List   Diagnosis Date Noted  . Bilateral pneumothorax 07/13/2018    Past Surgical History:  Procedure Laterality Date  . laporatomy         Home Medications    Prior to Admission medications   Medication Sig Start Date End Date Taking? Authorizing Provider  acetaminophen (TYLENOL) 325 MG tablet Take 2 tablets (650 mg total) by mouth every 4 (four) hours as needed. 07/15/18   Saverio Danker, PA-C  cyclobenzaprine (FLEXERIL) 5 MG tablet Take 1 tablet (5 mg total) by mouth 2 (two) times daily as needed for muscle spasms. 03/03/17   Horton, Barbette Hair, MD  ibuprofen (ADVIL) 600 MG tablet Take 1 tablet (600 mg total) by mouth every 8 (eight) hours as needed for fever or moderate pain. 07/15/18   Saverio Danker, PA-C  naproxen (NAPROSYN) 500 MG tablet Take 1 tablet (500 mg total) by mouth 2 (two) times daily. 03/03/17   Horton, Barbette Hair, MD  oxyCODONE (OXY IR/ROXICODONE) 5 MG immediate release tablet Take 1-2 tablets (5-10 mg total) by mouth every 4 (four) hours as needed for severe pain or breakthrough pain. 07/15/18   Saverio Danker,  PA-C  silver sulfADIAZINE (SILVADENE) 1 % cream Apply topically daily. 12/08/11   Hess, Hessie Diener, PA-C    Family History History reviewed. No pertinent family history.  Social History Social History   Tobacco Use  . Smoking status: Current Every Day Smoker    Types: Cigarettes  . Smokeless tobacco: Never Used  Substance Use Topics  . Alcohol use: Not Currently  . Drug use: Yes    Types: Marijuana     Allergies   Patient has no known allergies.   Review of Systems Review of Systems  Constitutional: Negative for fever.  HENT: Negative for sore throat.   Respiratory: Negative for shortness of breath.   Cardiovascular: Negative for chest pain.  Gastrointestinal: Negative for abdominal pain, nausea and vomiting.  Genitourinary: Negative for difficulty urinating, discharge, dysuria, frequency, penile pain, penile swelling, scrotal swelling and testicular pain.  Skin: Negative for rash.  Neurological: Negative for dizziness, light-headedness and headaches.     Physical Exam Triage Vital Signs ED Triage Vitals  Enc Vitals Group     BP 08/11/18 1847 (!) 141/97     Pulse Rate 08/11/18 1847 75     Resp 08/11/18 1847 16     Temp 08/11/18 1847 98.4 F (36.9 C)     Temp Source 08/11/18 1847 Oral     SpO2 08/11/18 1847  100 %     Weight --      Height --      Head Circumference --      Peak Flow --      Pain Score 08/11/18 1845 0     Pain Loc --      Pain Edu? --      Excl. in [place]? --    No data found.  Updated Vital Signs BP (!) 141/97 (BP Location: Left Arm)   Pulse 75   Temp 98.4 F (36.9 C) (Oral)   Resp 16   SpO2 100%   Visual Acuity Right Eye Distance:   Left Eye Distance:   Bilateral Distance:    Right Eye Near:   Left Eye Near:    Bilateral Near:     Physical Exam Vitals signs and nursing note reviewed.  Constitutional:      Appearance: He is well-developed.  HENT:     Head: Normocephalic and atraumatic.  Eyes:     Conjunctiva/sclera:  Conjunctivae normal.  Neck:     Musculoskeletal: Neck supple.  Cardiovascular:     Rate and Rhythm: Normal rate and regular rhythm.     Heart sounds: No murmur.  Pulmonary:     Effort: Pulmonary effort is normal. No respiratory distress.     Breath sounds: Normal breath sounds.  Abdominal:     Palpations: Abdomen is soft.     Tenderness: There is no abdominal tenderness.  Skin:    General: Skin is warm and dry.  Neurological:     Mental Status: He is alert.      UC Treatments / Results  Labs (all labs ordered are listed, but only abnormal results are displayed) Labs Reviewed  [place]    EKG   Radiology No results found.  Procedures Procedures (including critical care time)  Medications Ordered in UC Medications  azithromycin (ZITHROMAX) tablet 1,000 mg (1,000 mg Oral Given 08/11/18 1914)  azithromycin (ZITHROMAX) 250 MG tablet (has no administration in time range)    Initial Impression / Assessment and Plan / UC Course  I have reviewed the triage vital signs and the nursing notes.  Pertinent labs & imaging results that were available during my care of the patient were reviewed by me and considered in my medical decision making (see chart for details).     Empirically treated for chlamydia today with 1 g azithromycin.  Urine cytology obtained, will send off to check for gonorrhea, chlamydia and trichomonas.  Will call patient with results and provide further treatment if needed.Discussed strict return precautions. Patient verbalized understanding and is agreeable with plan.  Final Clinical Impressions(s) / UC Diagnoses   Final diagnoses:  Exposure to STD     Discharge Instructions     We have treated you today for chlamydia, with azithromycin. Please refrain from sexual activity for 7 days while medicine is clearing infection.  We are testing you for Gonorrhea, Chlamydia and Trichomonas. We will call you if anything is positive and let  you know if you require any further treatment. Please inform partner of any positive results.  Please return if symptoms not improving with treatment, development of fever, nausea, vomiting, abdominal pain, scrotal pain.   ED Prescriptions    None     Controlled Substance Prescriptions [place] Controlled Substance Registry consulted? Not Applicable   Janith Lima, [place] 08/12/18 0720

## 2018-08-16 LAB — URINE CYTOLOGY ANCILLARY ONLY
Chlamydia: POSITIVE — AB
Neisseria Gonorrhea: NEGATIVE
Trichomonas: NEGATIVE

## 2018-08-18 ENCOUNTER — Telehealth (HOSPITAL_COMMUNITY): Payer: Self-pay | Admitting: Emergency Medicine

## 2018-08-18 NOTE — Telephone Encounter (Signed)
Chlamydia is positive.  This was treated at the urgent care visit with po zithromax 1g.  Pt needs education to please refrain from sexual intercourse for 7 days to give the medicine time to work.  Sexual partners need to be notified and tested/treated.  Condoms may reduce risk of reinfection.  Recheck or followup with PCP for further evaluation if symptoms are not improving.  [facility] notified.  Patient contacted and made aware of all results, all questions answered.

## 2019-02-08 ENCOUNTER — Ambulatory Visit (HOSPITAL_COMMUNITY)
Admission: EM | Admit: 2019-02-08 | Discharge: 2019-02-08 | Disposition: A | Discharge status: Home / Self Care | Payer: Self-pay

## 2019-02-08 ENCOUNTER — Other Ambulatory Visit: Payer: Self-pay

## 2019-10-12 ENCOUNTER — Emergency Department (HOSPITAL_COMMUNITY): Payer: Self-pay

## 2019-10-12 ENCOUNTER — Emergency Department (HOSPITAL_COMMUNITY)
Admission: EM | Admit: 2019-10-12 | Discharge: 2019-10-12 | Discharge status: Against Medical Advice | Payer: Self-pay | Attending: Emergency Medicine | Admitting: Emergency Medicine

## 2019-10-12 ENCOUNTER — Other Ambulatory Visit: Payer: Self-pay

## 2019-10-12 DIAGNOSIS — X58XXXA Exposure to other specified factors, initial encounter: Secondary | ICD-10-CM | POA: Insufficient documentation

## 2019-10-12 DIAGNOSIS — Y929 Unspecified place or not applicable: Secondary | ICD-10-CM | POA: Insufficient documentation

## 2019-10-12 DIAGNOSIS — Z5321 Procedure and treatment not carried out due to patient leaving prior to being seen by health care provider: Secondary | ICD-10-CM | POA: Insufficient documentation

## 2019-10-12 DIAGNOSIS — Y939 Activity, unspecified: Secondary | ICD-10-CM | POA: Insufficient documentation

## 2019-10-12 DIAGNOSIS — S61211A Laceration without foreign body of left index finger without damage to nail, initial encounter: Secondary | ICD-10-CM | POA: Insufficient documentation

## 2019-10-12 DIAGNOSIS — Y999 Unspecified external cause status: Secondary | ICD-10-CM | POA: Insufficient documentation

## 2019-10-12 NOTE — ED Triage Notes (Signed)
Stated laceration on left finger; incident occurred while doing yard work around 1430 ; unknown last tetanus shot.

## 2019-10-12 NOTE — ED Notes (Signed)
Called x3 with no response for vitals recheck

## 2019-10-12 NOTE — ED Notes (Signed)
Called for vitals recheck x1 with no response

## 2020-03-31 IMAGING — DX PORTABLE CHEST - 1 VIEW
1 series · 1 of 1 positions shown · non-contrast
Comparison: 07/15/2018 at 7812 hours

CLINICAL DATA: Follow-up chest tube removal.

EXAM:
PORTABLE CHEST 1 VIEW

[chest ap]
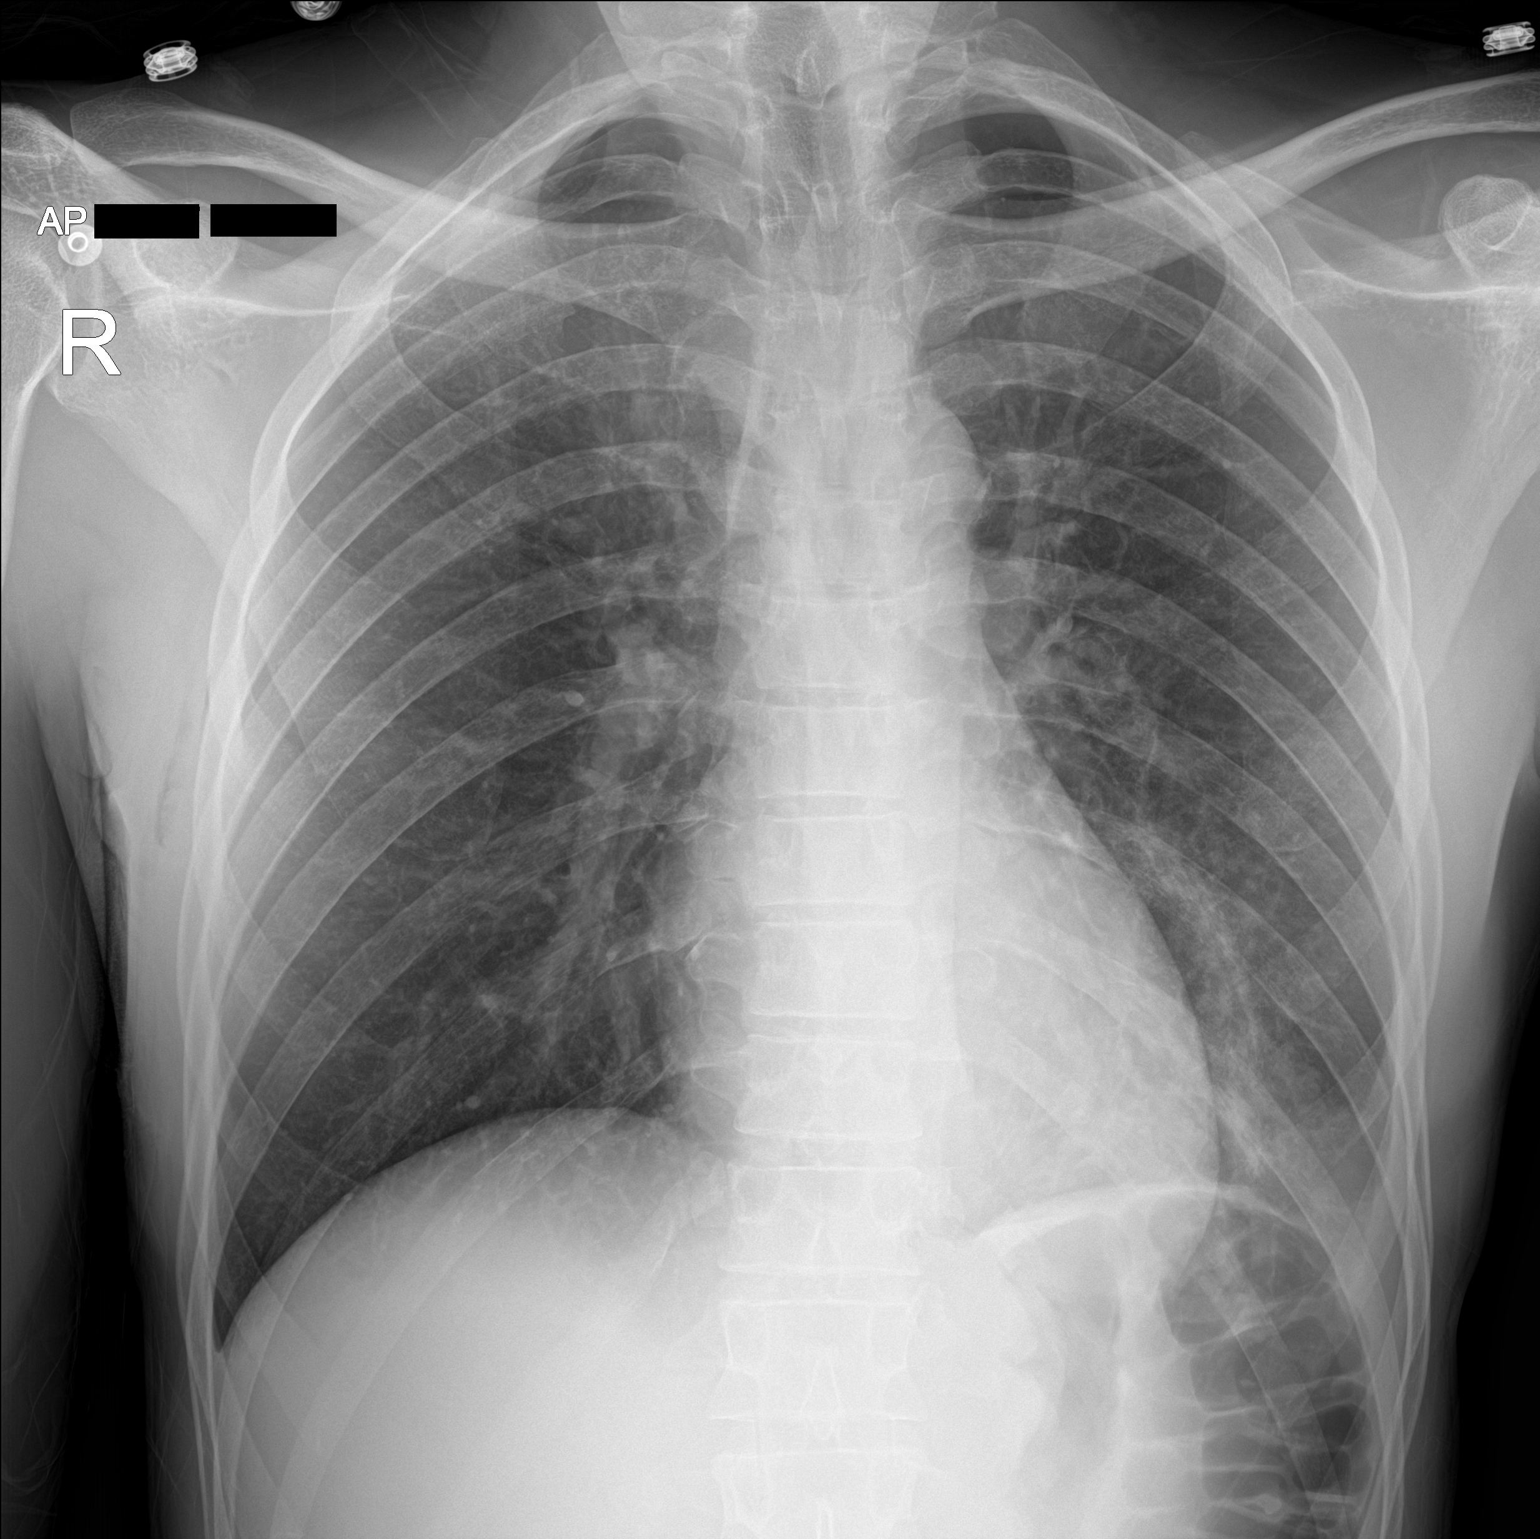

[1 of 1 positions shown; findings below may reference images not displayed]

FINDINGS: Right chest tube has been removed. There is a tiny right apical
pneumothorax that is stable. Persistent patchy densities in the left
lower lung and left lung base are stable. Heart size is stable. The
trachea is midline.
IMPRESSION: Removal of the right chest tube with a tiny right apical
pneumothorax. Tiny amount of right pleural air is stable from the
prior examination.

Persistent patchy densities at the left lung base.
